# Patient Record
Sex: Female | Born: 1950 | Marital: Married
Health system: Southern US, Community
[De-identification: ages and names within clinical notes are randomized; demographics above are authoritative.]

---

## 2013-09-07 ENCOUNTER — Ambulatory Visit
Admission: RE | Admit: 2013-09-07 | Discharge: 2013-09-07 | Disposition: A | Discharge status: Home / Self Care | Payer: Managed Care, Other (non HMO) | Source: Ambulatory Visit | Attending: Family Medicine | Admitting: Family Medicine

## 2013-09-07 ENCOUNTER — Other Ambulatory Visit: Payer: Self-pay | Admitting: Family Medicine

## 2013-09-07 DIAGNOSIS — R059 Cough, unspecified: Secondary | ICD-10-CM

## 2013-09-07 DIAGNOSIS — R05 Cough: Secondary | ICD-10-CM

## 2013-09-13 ENCOUNTER — Other Ambulatory Visit: Payer: Self-pay | Admitting: Family Medicine

## 2013-09-13 DIAGNOSIS — R9389 Abnormal findings on diagnostic imaging of other specified body structures: Secondary | ICD-10-CM

## 2014-11-15 IMAGING — CR DG CHEST 2V
2 series · 2 of 2 positions shown · non-contrast
Comparison: None.

CLINICAL DATA: Productive cough with blood in sputum for 2 weeks.
Question pneumonia.

EXAM:
CHEST  2 VIEW

[w chest pa]
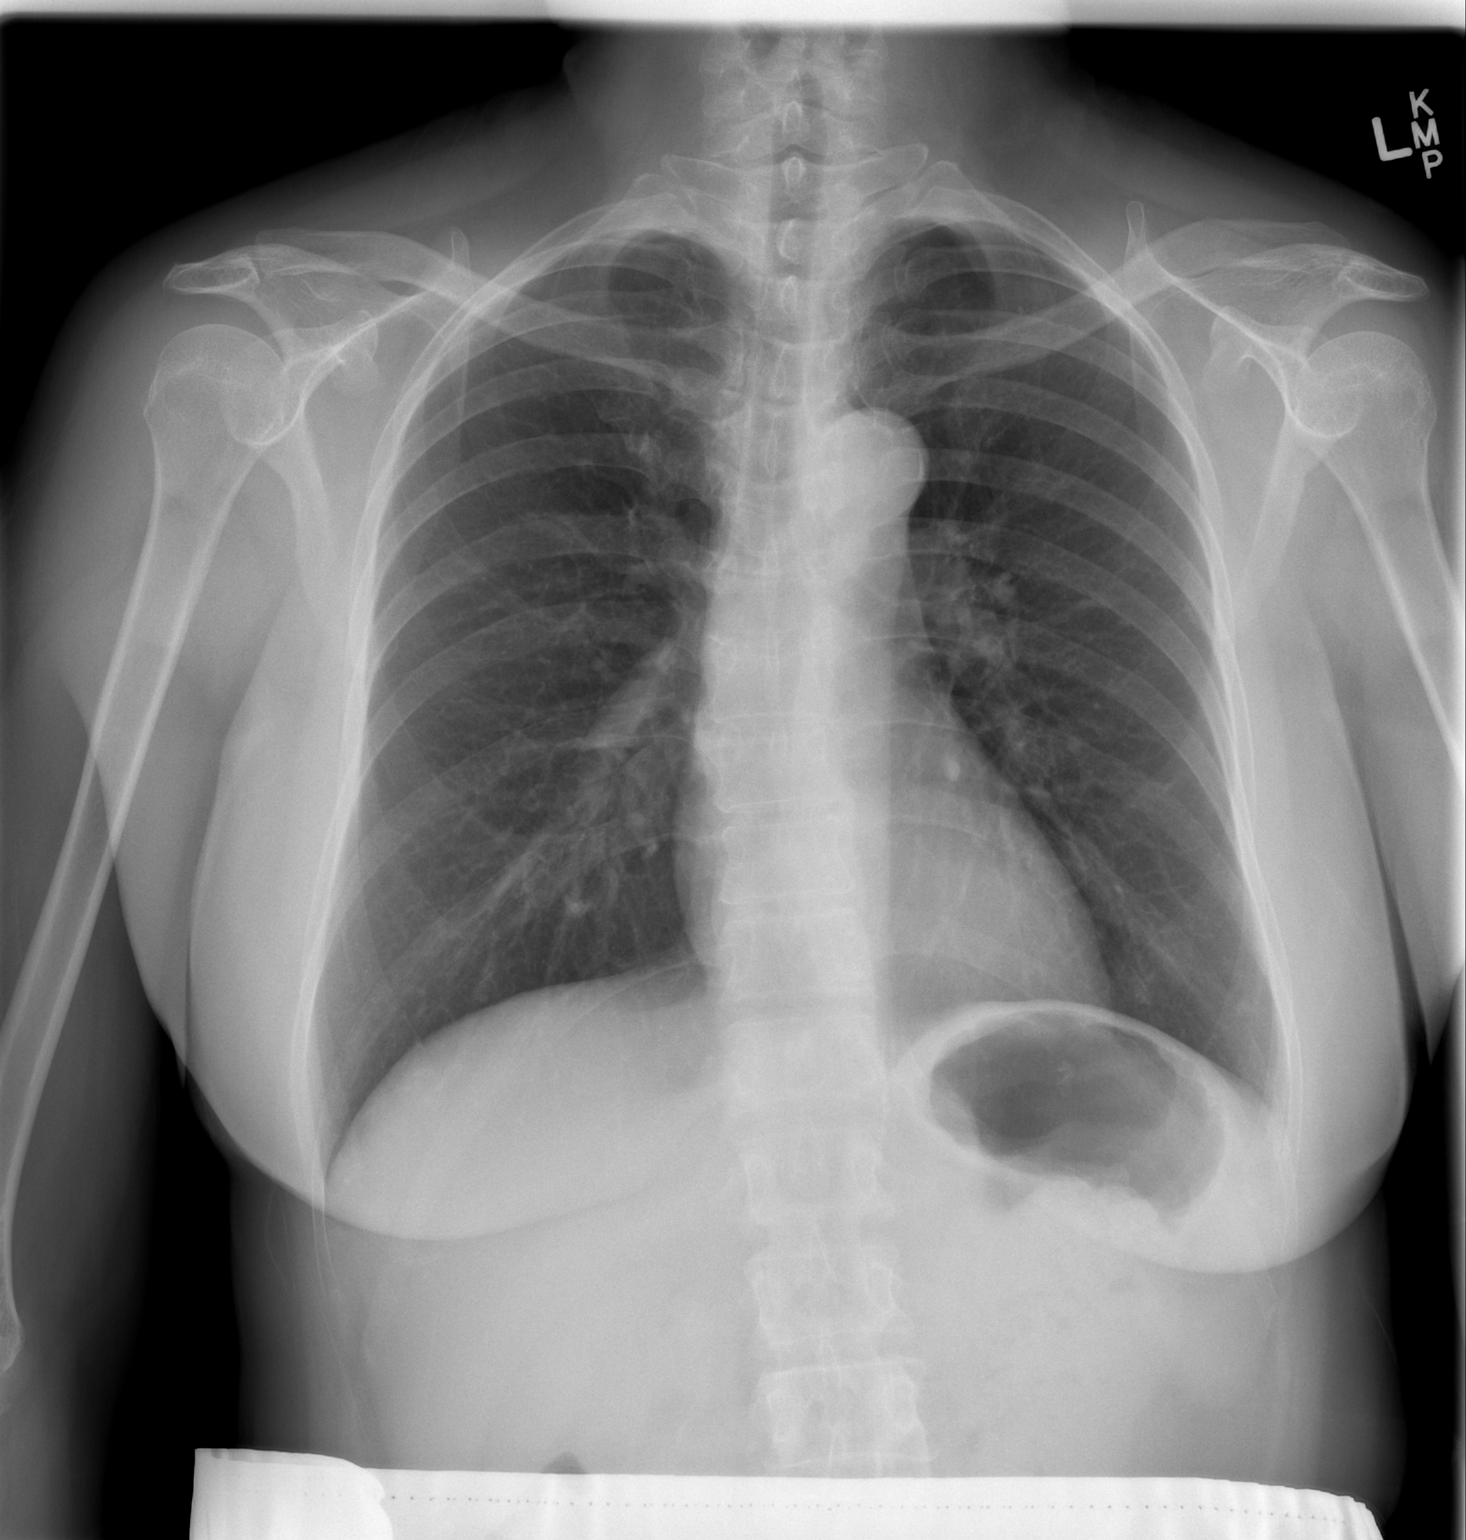

[w chest lat]
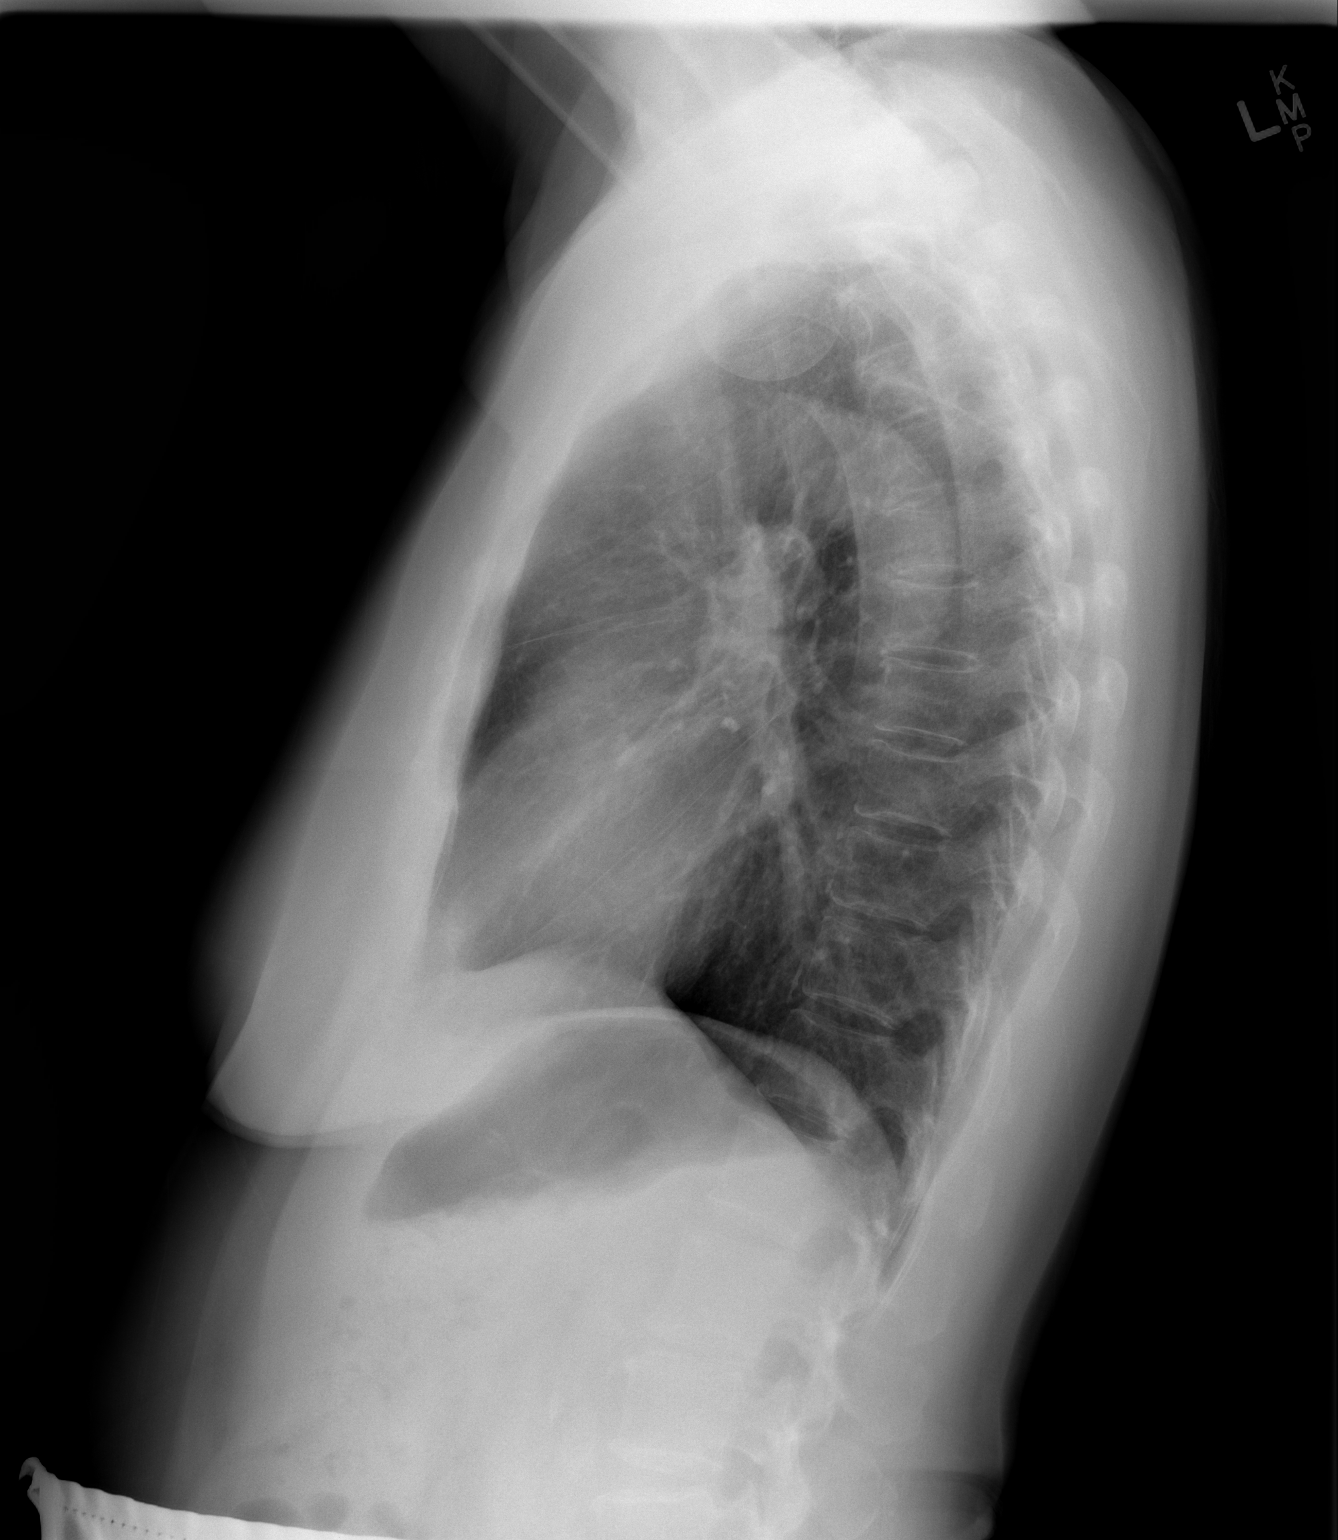

[2 of 2 positions shown; findings below may reference images not displayed]

FINDINGS: The heart size and mediastinal contours are normal. The possibility
of a small left infrahilar calcified lymph node is raised, although
this could reflect a vessel on end. No other calcified nodes are
identified. The lungs are clear. There is no pleural effusion or
pneumothorax. There is a mild convex right thoracic scoliosis.
IMPRESSION: No acute cardiopulmonary process. No specific radiographic evidence
of tuberculosis ; a small calcified left infrahilar granuloma cannot
be completely excluded.
